# Patient Record
Sex: Female | Born: 1975 | Race: Black or African American | Hispanic: No | Smoking: Never smoker
Health system: Southern US, Community
[De-identification: ages and names within clinical notes are randomized; demographics above are authoritative.]

## PROBLEM LIST (undated history)

## (undated) DIAGNOSIS — D649 Anemia, unspecified: Secondary | ICD-10-CM

## (undated) DIAGNOSIS — G43909 Migraine, unspecified, not intractable, without status migrainosus: Secondary | ICD-10-CM

## (undated) DIAGNOSIS — D72819 Decreased white blood cell count, unspecified: Secondary | ICD-10-CM

## (undated) HISTORY — DX: Decreased white blood cell count, unspecified: D72.819

## (undated) HISTORY — DX: Anemia, unspecified: D64.9

---

## 2003-04-11 ENCOUNTER — Other Ambulatory Visit: Admission: RE | Admit: 2003-04-11 | Discharge: 2003-04-11 | Payer: Self-pay | Admitting: Obstetrics and Gynecology

## 2003-06-07 ENCOUNTER — Inpatient Hospital Stay: Admission: AD | Admit: 2003-06-07 | Discharge: 2003-06-07 | Payer: Self-pay

## 2003-10-18 ENCOUNTER — Inpatient Hospital Stay (HOSPITAL_COMMUNITY): Admission: AD | Admit: 2003-10-18 | Discharge: 2003-10-22 | Payer: Self-pay | Admitting: Obstetrics and Gynecology

## 2003-11-26 ENCOUNTER — Other Ambulatory Visit: Admission: RE | Admit: 2003-11-26 | Discharge: 2003-11-26 | Payer: Self-pay | Admitting: Obstetrics and Gynecology

## 2008-07-17 ENCOUNTER — Emergency Department (HOSPITAL_COMMUNITY): Admission: EM | Admit: 2008-07-17 | Discharge: 2008-07-17 | Payer: Self-pay | Admitting: Emergency Medicine

## 2010-07-23 LAB — POCT I-STAT, CHEM 8
Chloride: 106 mEq/L (ref 96–112)
Glucose, Bld: 121 mg/dL — ABNORMAL HIGH (ref 70–99)
HCT: 38 % (ref 36.0–46.0)
Potassium: 4.5 mEq/L (ref 3.5–5.1)
Sodium: 136 mEq/L (ref 135–145)

## 2010-07-23 LAB — URINALYSIS, ROUTINE W REFLEX MICROSCOPIC
Bilirubin Urine: NEGATIVE
Glucose, UA: NEGATIVE mg/dL
Ketones, ur: NEGATIVE mg/dL
Specific Gravity, Urine: 1.029 (ref 1.005–1.030)
pH: 7 (ref 5.0–8.0)

## 2010-08-29 NOTE — Op Note (Signed)
NAME:  Veronica Medina, Veronica Medina                        ACCOUNT NO.:  [id number]   MEDICAL RECORD NO.:  [id number]                   PATIENT TYPE:  INP   LOCATION:  9140                                 FACILITY:  [facility]   PHYSICIAN:  Guy Sandifer. Arleta Creek, M.D.           DATE OF BIRTH:  [birth year]   DATE OF PROCEDURE:  10/19/2003  DATE OF DISCHARGE:                                 OPERATIVE REPORT   PREOPERATIVE DIAGNOSES:  1. Intrauterine pregnancy at 54 1/2 weeks estimated gestational age.  2. Previous cesarean section desires repeat.  3. Labor.   POSTOPERATIVE DIAGNOSES:  1. Intrauterine pregnancy at 61 1/2 weeks estimated gestational age.  2. Previous cesarean section desires repeat.  3. Labor.   PROCEDURE:  Low transverse cesarean section.   SURGEON:  Guy Sandifer. Henderson Cloud, M.D.   ANESTHESIA:  Spinal, Burnett Corrente, M.D.   ESTIMATED BLOOD LOSS:  600 mL.   FINDINGS:  Viable female infant Apgar of 9 & 9 at 1 and 5 minutes  respectively.  Birth weight 6 pounds 9 ounces, arterial cord pH 7.32.   INDICATIONS AND CONSENTS:  This patient is a 35 year old, married, black  female, G2, [place], EDC of July 17, 20052005, prenatal care complicated by  previous cesarean section. She desires repeat. Group B strep culture is  negative. The patient complains of contractions. She is found to have a  cervix fingertip dilated, 90% effaced, -1 station with regular uterine  contractions. Diagnosis of labor is made. Repeat cesarean section is  discussed with the patient. The potential risks and complications including  but not limited to infection, bowel, ureteral damage, bleeding requiring  transfusion of blood products and possible transfusion reaction, HIV and  hepatitis acquisition, DVT, PE, and pneumonia is discussed preoperatively.  The patient initially desires tubal ligation.  Risk and permanence are  discussed.  However, prior to going to the operating room, the patient  declines tubal ligation. This  change is designated and initialed on the  consent form prior to transporting to the surgery suite.   DESCRIPTION OF PROCEDURE:  The patient was taken to the operating room where  she was identified, spinal anesthetic was placed and placed in the dorsal  supine position with a 15 degree left lateral wedge.  She was prepped, Foley  catheter was placed, the bladder was drained and she was draped in a sterile  fashion. After testing for adequate spinal anesthesia, the previous  Pfannenstiel scar was taken out on the way in and dissection was carried out  in layers to the peritoneum. The peritoneum is incised, extended superiorly  and inferiorly. The vesicouterine peritoneum is taken down cephalolaterally,  the bladder flap is developed and the bladder blade is placed.  The uterus  is incised in a low transverse manner and the uterus is entered bluntly with  a hemostat. The uterine incision is extended cephalolaterally with the  fingers.  Artificial rupture of membranes  for clear fluid is carried out.  The baby is posterior. Vertex is delivered and the oral and nasopharynx are  suctioned.  The single nuchal cord is reduced. The baby is delivered. Good  cry and tone is noted, the cord is clamped and the baby is handed to the  waiting pediatrics team. The placenta is manually delivered.  The uterus is  clean. The uterus is closed in two running locking imbricating layers of #0  Monocryl suture. The tubes and ovaries palpate normally. The anterior  peritoneum is closed in a running fashion with #0 Monocryl suture which is  also used to reapproximate the pyramidalis muscle in the midline.  The  fascia is closed in a running fashion with #0 PDS suture and the skin is  closed with clips. All sponge, instrument and needle counts are correct and  the patient is transferred to the recovery room in stable condition.                                               Guy Sandifer Arleta Creek, M.D.    JET/MEDQ   D:  10/19/2003  T:  10/19/2003  Job:  [id number]

## 2010-08-29 NOTE — Discharge Summary (Signed)
NAME:  Veronica Medina, Veronica Medina                        ACCOUNT NO.:  [id number]   MEDICAL RECORD NO.:  [id number]                   PATIENT TYPE:  INP   LOCATION:  9140                                 FACILITY:  [facility]   PHYSICIAN:  Dineen Kid. Rana Snare, M.D.                 DATE OF BIRTH:  30-Apr-1975   DATE OF ADMISSION:  10/18/2003  DATE OF DISCHARGE:  10/22/2003                                 DISCHARGE SUMMARY   ADMITTING DIAGNOSES:  1. Intrauterine pregnancy at 90 and five-sevenths weeks estimated     gestational age.  2. Active labor.  3. Previous cesarean section, desires repeat.   DISCHARGE DIAGNOSES:  1. Status post low transverse cesarean section.  2. Viable female infant.   PROCEDURE:  Repeat low transverse cesarean section.   REASON FOR ADMISSION:  Please see written H&P.   HOSPITAL COURSE:  The patient is a 35 year old African-American married  female gravida 2 para 1 that presented to [facility] in  labor.  The patient was found to have a cervix that was a fingertip dilated,  90% effaced, vertex at a -1 station with regular uterine contractions.  The  patient had had a previous cesarean and she desired repeat.  The patient was  then taken to the operating room where spinal anesthesia was administered  without difficulty.  A low transverse incision was made with the delivery of  a viable female infant weighing 6 pounds 9 ounces with Apgars of 9 at one  minute and 9 at five minutes.  Umbilical cord pH was 7.32.  The patient  tolerated the procedure well and was taken to the recovery room in stable  condition.  On postoperative day #1 the patient was without complaint.  Vital signs were stable; she was afebrile.  Abdomen was soft with good  return of bowel function.  Fundus was firm and nontender.  Abdominal  dressing was noted to be clean, dry, and intact.  On postoperative day #2  vital signs were stable; she was afebrile.  Abdomen was soft, fundus was  firm.   Abdominal dressing had been removed revealing an incision that was  clean, dry, and intact.  She was ambulating well, tolerating a regular diet  without complaints of nausea and vomiting.  Labs revealed hemoglobin of 8.6.  The patient was started on some iron supplementation and CBC was reordered  for the a.m.  On postoperative day #3 vital signs were stable; she was  afebrile.  Fundus was firm and nontender.  Incision was clean, dry, and  intact.  Staples were removed.  Repeat CBC revealed hemoglobin of 8.8 which  was stable and the patient was discharged home.   CONDITION ON DISCHARGE:  Good.   DIET:  Regular as tolerated.   ACTIVITY:  No heavy lifting, no driving x2 weeks, no vaginal entry.   FOLLOW-UP:  The patient is to follow up in the  office in 1 week for an  incision check.  She is to call for temperature greater than 100 degrees,  persistent nausea and vomiting, heavy vaginal bleeding, and/or redness or  drainage from the incisional site.   DISCHARGE MEDICATIONS:  1. Tylox #30 one p.o. q.4-6h. p.r.n.  2. Motrin 600 mg q.6h.  3. Ferrous sulfate 325 mg one p.o. b.i.d. with meals.  4. Colace one p.o. daily p.r.n.     Julio Sicks, N.P.                        Dineen Kid Rana Snare, M.D.    CC/MEDQ  D:  11/20/2003  T:  11/20/2003  Job:  [id number]

## 2011-12-11 ENCOUNTER — Encounter (HOSPITAL_COMMUNITY): Payer: Self-pay | Admitting: *Deleted

## 2011-12-11 ENCOUNTER — Emergency Department (HOSPITAL_COMMUNITY)
Admission: EM | Admit: 2011-12-11 | Discharge: 2011-12-11 | Disposition: A | Discharge status: Home / Self Care | Payer: Self-pay | Attending: Emergency Medicine | Admitting: Emergency Medicine

## 2011-12-11 DIAGNOSIS — G43909 Migraine, unspecified, not intractable, without status migrainosus: Secondary | ICD-10-CM | POA: Insufficient documentation

## 2011-12-11 HISTORY — DX: Migraine, unspecified, not intractable, without status migrainosus: G43.909

## 2011-12-11 MED ORDER — DIPHENHYDRAMINE HCL 12.5 MG/5ML PO ELIX
ORAL_SOLUTION | ORAL | Status: AC
  Administered 2011-12-11: 19:00:00
  Filled 2011-12-11: qty 5

## 2011-12-11 MED ORDER — IBUPROFEN 800 MG PO TABS
800.0000 mg | ORAL_TABLET | Freq: Once | ORAL | Status: AC
  Administered 2011-12-11: 800 mg via ORAL
  Filled 2011-12-11: qty 1

## 2011-12-11 MED ORDER — NAPROXEN 500 MG PO TABS: 500.0000 mg | ORAL_TABLET | Freq: Two times a day (BID) | ORAL | Status: AC

## 2011-12-11 MED ORDER — DIPHENHYDRAMINE HCL 12.5 MG/5ML PO ELIX
25.0000 mg | ORAL_SOLUTION | Freq: Once | ORAL | Status: AC
  Administered 2011-12-11: 25 mg via ORAL
  Filled 2011-12-11: qty 5

## 2011-12-11 MED ORDER — DIAZEPAM 5 MG PO TABS
5.0000 mg | ORAL_TABLET | Freq: Once | ORAL | Status: AC
  Administered 2011-12-11: 5 mg via ORAL
  Filled 2011-12-11: qty 1

## 2011-12-11 MED ORDER — METOCLOPRAMIDE HCL 5 MG/ML IJ SOLN
10.0000 mg | Freq: Once | INTRAMUSCULAR | Status: AC
  Administered 2011-12-11: 10 mg via INTRAMUSCULAR
  Filled 2011-12-11: qty 2

## 2011-12-11 MED ORDER — IBUPROFEN 800 MG PO TABS
ORAL_TABLET | ORAL | Status: AC
  Administered 2011-12-11: 19:00:00
  Filled 2011-12-11: qty 1

## 2011-12-11 NOTE — ED Provider Notes (Signed)
History     CSN: [id number]  Arrival date & time 12/11/11  1433   First MD Initiated Contact with Patient 12/11/11 1739      Chief Complaint  Patient presents with  . Migraine    (Consider location/radiation/quality/duration/timing/severity/associated sxs/prior treatment) Patient is a 36 y.o. female presenting with migraine and headaches. The history is provided by the patient.  Migraine This is a recurrent problem. The current episode started today. The problem occurs rarely. The problem has been gradually worsening. Associated symptoms include headaches, nausea and a visual change (blurred vision, photophobia). Pertinent negatives include no abdominal pain, anorexia, arthralgias, change in bowel habit, chest pain, chills, congestion, coughing, diaphoresis, fatigue, fever, myalgias, neck pain, numbness, rash, sore throat, swollen glands, urinary symptoms, vomiting or weakness. Exacerbated by: light sound and movement  She has tried nothing for the symptoms.  Headache  The headache is associated with the menstrual cycle. The pain is located in the bilateral and frontal region. The quality of the pain is described as throbbing. The pain is at a severity of 8/10. The pain is moderate. The pain does not radiate. Associated symptoms include nausea. Pertinent negatives include no anorexia, no fever, no malaise/fatigue, no chest pressure, no near-syncope, no orthopnea, no palpitations, no syncope, no shortness of breath and no vomiting. She has tried nothing for the symptoms.    Past Medical History  Diagnosis Date  . Migraine     Past Surgical History  Procedure Date  . Cesarean section     No family history on file.  History  Substance Use Topics  . Smoking status: Never Smoker   . Smokeless tobacco: Not on file  . Alcohol Use: Yes     occasionally    OB History    Grav Para Term Preterm Abortions TAB SAB Ect Mult Living                  Review of Systems  Constitutional:  Negative for fever, chills, malaise/fatigue, diaphoresis and fatigue.  HENT: Negative for congestion, sore throat and neck pain.   Eyes: Positive for photophobia. Negative for pain.  Respiratory: Negative for cough and shortness of breath.   Cardiovascular: Negative for chest pain, palpitations, orthopnea, syncope and near-syncope.  Gastrointestinal: Positive for nausea. Negative for vomiting, abdominal pain, anorexia and change in bowel habit.  Musculoskeletal: Negative for myalgias and arthralgias.  Skin: Negative for rash.  Neurological: Positive for headaches. Negative for weakness and numbness.  All other systems reviewed and are negative.    Allergies  Aspirin  Home Medications   Current Outpatient Rx  Name Route Sig Dispense Refill  . ACETAMINOPHEN 500 MG PO TABS Oral Take 1,000 mg by mouth every 6 (six) hours as needed. Pain    . ADULT MULTIVITAMIN W/MINERALS CH Oral Take 1 tablet by mouth daily.    Marland Kitchen NORGESTIMATE-ETH ESTRADIOL 0.25-35 MG-MCG PO TABS Oral Take 1 tablet by mouth daily.      BP 98/64  Pulse 77  Temp 98.1 F (36.7 C) (Oral)  Resp 18  SpO2 100%  LMP 11/20/2011  Physical Exam  Nursing note and vitals reviewed. Constitutional: She is oriented to person, place, and time. She appears well-developed and well-nourished. No distress.  HENT:  Head: Normocephalic and atraumatic.  Right Ear: External ear normal.  Left Ear: External ear normal.  Eyes: Conjunctivae and EOM are normal. Pupils are equal, round, and reactive to light. Right eye exhibits no discharge. Left eye exhibits no discharge. Right conjunctiva  is not injected. Right conjunctiva has no hemorrhage. Left conjunctiva is not injected. Left conjunctiva has no hemorrhage. No scleral icterus. Right eye exhibits no nystagmus. Left eye exhibits no nystagmus.  Neck: Normal range of motion and full passive range of motion without pain. Neck supple. No spinous process tenderness present. No rigidity.    Cardiovascular: Normal rate, regular rhythm, normal heart sounds and intact distal pulses.   Pulmonary/Chest: Effort normal and breath sounds normal. No respiratory distress.  Musculoskeletal: Normal range of motion.  Neurological: She is alert and oriented to person, place, and time. She has normal strength. No cranial nerve deficit or sensory deficit. Coordination and gait normal.  Skin: Skin is warm and dry. No rash noted. She is not diaphoretic.    ED Course  Procedures (including critical care time)  Labs Reviewed - No data to display No results found.   No diagnosis found.    MDM  Migraine   Pt HA treated and improved while in ED.  Presentation is like pts typical HA and non concerning for [facility], ICH, Meningitis, or temporal arteritis. Pt is afebrile with no focal neuro deficits, nuchal rigidity, or change in vision. Pt is to follow up with PCP to discuss prophylactic medication. Pt verbalizes understanding and is agreeable with plan to dc.          Jaci Carrel, [place] 12/11/11 1933

## 2011-12-11 NOTE — ED Notes (Signed)
Pt reports migraine x1hr. Hx migraines in last year. Sometimes she feels like she can sleep them off or take excedrin. Has not tried anything for this migraine. +light and sound sensitivity, nausea. Denies vomiting.

## 2011-12-12 NOTE — ED Provider Notes (Signed)
Medical screening examination/treatment/procedure(s) were performed by non-physician practitioner and as supervising physician I was immediately available for consultation/collaboration.  Amberlie Gaillard, MD 12/12/11 0010 

## 2015-04-23 ENCOUNTER — Other Ambulatory Visit: Payer: Self-pay | Admitting: Obstetrics and Gynecology

## 2015-04-23 DIAGNOSIS — N6320 Unspecified lump in the left breast, unspecified quadrant: Secondary | ICD-10-CM

## 2015-04-25 ENCOUNTER — Other Ambulatory Visit: Payer: Self-pay

## 2015-04-29 ENCOUNTER — Ambulatory Visit
Admission: RE | Admit: 2015-04-29 | Discharge: 2015-04-29 | Disposition: A | Discharge status: Home / Self Care | Payer: BLUE CROSS/BLUE SHIELD | Source: Ambulatory Visit | Attending: Obstetrics and Gynecology | Admitting: Obstetrics and Gynecology

## 2015-04-29 DIAGNOSIS — N6320 Unspecified lump in the left breast, unspecified quadrant: Secondary | ICD-10-CM

## 2016-02-26 ENCOUNTER — Other Ambulatory Visit: Payer: Self-pay | Admitting: Obstetrics and Gynecology

## 2016-02-26 DIAGNOSIS — N6459 Other signs and symptoms in breast: Secondary | ICD-10-CM

## 2016-02-26 DIAGNOSIS — N63 Unspecified lump in unspecified breast: Secondary | ICD-10-CM

## 2016-03-17 ENCOUNTER — Other Ambulatory Visit: Payer: BLUE CROSS/BLUE SHIELD

## 2016-03-23 ENCOUNTER — Ambulatory Visit
Admission: RE | Admit: 2016-03-23 | Discharge: 2016-03-23 | Disposition: A | Discharge status: Home / Self Care | Payer: BLUE CROSS/BLUE SHIELD | Source: Ambulatory Visit | Attending: Obstetrics and Gynecology | Admitting: Obstetrics and Gynecology

## 2016-03-23 DIAGNOSIS — N6459 Other signs and symptoms in breast: Secondary | ICD-10-CM

## 2016-03-23 DIAGNOSIS — N63 Unspecified lump in unspecified breast: Secondary | ICD-10-CM

## 2017-04-27 ENCOUNTER — Other Ambulatory Visit: Payer: Self-pay | Admitting: Obstetrics and Gynecology

## 2017-04-27 DIAGNOSIS — N6320 Unspecified lump in the left breast, unspecified quadrant: Secondary | ICD-10-CM

## 2017-05-03 ENCOUNTER — Other Ambulatory Visit: Payer: BLUE CROSS/BLUE SHIELD

## 2017-05-07 ENCOUNTER — Other Ambulatory Visit: Payer: Self-pay

## 2017-05-10 ENCOUNTER — Ambulatory Visit: Payer: BLUE CROSS/BLUE SHIELD

## 2017-05-10 ENCOUNTER — Ambulatory Visit
Admission: RE | Admit: 2017-05-10 | Discharge: 2017-05-10 | Disposition: A | Discharge status: Home / Self Care | Payer: BLUE CROSS/BLUE SHIELD | Source: Ambulatory Visit | Attending: Obstetrics and Gynecology | Admitting: Obstetrics and Gynecology

## 2017-05-10 ENCOUNTER — Other Ambulatory Visit: Payer: Self-pay | Admitting: Obstetrics and Gynecology

## 2017-05-10 DIAGNOSIS — N6320 Unspecified lump in the left breast, unspecified quadrant: Secondary | ICD-10-CM

## 2017-05-10 DIAGNOSIS — Z1231 Encounter for screening mammogram for malignant neoplasm of breast: Secondary | ICD-10-CM

## 2017-05-18 ENCOUNTER — Ambulatory Visit (HOSPITAL_COMMUNITY): Payer: BLUE CROSS/BLUE SHIELD

## 2017-05-24 ENCOUNTER — Other Ambulatory Visit: Payer: Self-pay

## 2017-05-24 ENCOUNTER — Ambulatory Visit (HOSPITAL_COMMUNITY)
Admission: RE | Admit: 2017-05-24 | Discharge: 2017-05-24 | Disposition: A | Discharge status: Home / Self Care | Payer: BLUE CROSS/BLUE SHIELD | Source: Ambulatory Visit | Attending: Obstetrics and Gynecology | Admitting: Obstetrics and Gynecology

## 2017-05-24 DIAGNOSIS — Z148 Genetic carrier of other disease: Secondary | ICD-10-CM

## 2017-05-25 DIAGNOSIS — Z148 Genetic carrier of other disease: Secondary | ICD-10-CM | POA: Insufficient documentation

## 2017-05-25 NOTE — Progress Notes (Signed)
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    In summary:  Discussed Fragile X syndrome and X-linked inheritance  Discussed that FMR1 mutations display "anticipation" meaning the possibility to expand to larger mutations when passed to subsequent generations  Patient's CGG repeat size in FMR1 is considered a "premutation," and is at the lower end of the premutation range (55-200 CGG repeats)  Discussed 50% risk to pass down FMR1 allele to each offspring, but risk to expand to full mutation if inherited is approximately <1%   AGG interruption studies are currently pending; this has been shown to further refine the risk for premutation expansion in subsequent generations  Discussed increased risk for Premature ovarian insufficieny and Fragile X associated tremor/ataxia syndrome for premutation carriers  Discussed option of prenatal diagnosis in a future pregnancy via CVS or amniocentesis  Veronica Medina reported his father has hemochromatosis  Reviewed autosomal recessive inheritance and that Veronica Medina would be obligate carrier based on this history  Discussed that he should make his PCP aware of this history   Veronica Medina had pan-ethnic carrier screening ([facility] [employer] through [employer]) facilitated through her OB/GYN office. 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 Specifically, she was reported to be positive for a premutation size 55 CGG repeat allele and a normal size 29 CGG repeat allele in the FMR1 genes. We discussed that her carrier screening was negative for the additional mutations/3 conditions  screened (cystic fibrosis, spinal muscular atrophy, and duchenne muscular dystrophy). Thus, her risk to be a carrier for these additional conditions has been reduced but not eliminated. Veronica Medina reported no known family history of Fragile X syndrome and no known additional relatives to be premutation or mutation carriers of Fragile X syndrome. The couple has been seen by Reproductive Endocrinology and Infertility provider at [facility] given a recent history of infertility. She reported no known family history of tremor or ataxia or additional family history of fertility concerns.   We discussed chromosomes and genes. We reviewed that females typically have two X chromosomes, and males typically have one X and one Y chromosome. We reviewed the general concepts of X-linked inheritance given that the causative gene for Fragile X syndrome, FMR1, is located on the X chromosome. In X-linked inheritance, a female passes on his X chromosome to all of his daughters and none of his sons. For a female, there is a 1 in 2 (50%) chance of passing on a particular X chromosome to each child. Thus, when a female is a carrier for an X-linked condition, having one copy of a nonworking gene on one X chromosome, there is a 25% chance for each of the following: 1) a daughter who inherits the nonworking gene and is also a carrier, 2) a daughter who inherits the working copy of the gene and is neither a carrier nor affected, 3) a son who inherits the nonworking copy of the gene and is affected, or 4) a son who inherits the working copy of the gene and is unaffected.   Fragile X syndrome is a form of inherited intellectual disability where predominantly affected males are born  to typically unaffected females, though there is variability in the condition. Females who are carriers of a full mutation may be asymptomatic or have milder features of Fragile X syndrome. More than 99% of individuals with Fragile X syndrome  results from an expanded segment (an expansion of the CGG trinucleotide repeat) of the FMR1 gene on the X chromosome. (Less than 1% of individuals with Fragile X syndrome have a deletion of FMR1 or an intragenic FMR1 mutation.) Regarding the number of trinucleotide repeats in the FMR1 gene, less than 45 repeats of this region is considered within normal limits. People with 45-54 repeats are typically considered to be in the "intermediate" or "gray zone" range. Individuals with 55-200 repeats are considered "premutation carriers" and are at personal risk to develop certain health concerns including premature ovarian insufficiency (POI) in females and Fragile X-associated tremor/ataxia syndrome (FXTAS) in males and females. Female premutation carriers have an estimated up to 17% risk of developing FXTAS. We discussed that symptoms of FXTAS may include ataxia, tremor, memory loss, and peripheral neuropathy with onset after age 39 years. Features of primary ovarian insufficiency may include irregular menstrual cycles, early-onset elevated levels of FSH, early menopause, and infertility. Approximately 10-20% of women who carry a premutation in FMR1 have features of POI, with repeat sizes on the lower end of the premutation range less associated with development of POI.  We discussed that Fragile X syndrome demonstrates anticipation, meaning that the CGG trinucleotide repeat has the potential to expand in size when passed to subsequent generations. Females who are premutation carriers are at increased risk to have children affected with Fragile X syndrome, given the chance for a premutation to expand to a full mutation when transmitted to offspring. Individuals with greater than 200 repeats have full mutations and are expected to be clinically affected, if female, and be carriers or possibly clinically affected, if female. Carriers of full mutations (>200 CGG repeats) can be asymptomatic or have mild symptoms, such as  learning disabilities. Larger repeat sizes are more associated with increased risk for repeat size expansion if inherited by a subsequent generation. Available literature has shown that individuals who also have an AGG trinucleotide repeat within their FMR1 gene have a lower chance of the CGG repeat expanding when passed to offspring. Veronica Medina AGG interruption studies are currently pending. We discussed that this will help refine risk for premutation expansion if inherited by a subsequent generation. Overall, for premutations in the range of 55-59 CGG repeats, risk of expansion to a full mutation in subsequent generations is estimated to be less than 1% (Nolin et al. 2003 and Nolin et al. 2011).     In summary, Veronica Medina's offspring have a 1 in 2 (50%) chance to have inherited the FMR1 allele with the expanded CGG repeats; if the premutation allele was inherited, the risk that it has expanded to a full mutation is approximately <1% for carriers with a premutation in the range of 55-59 CGG repeats. We discussed the prenatal diagnosis for Fragile X would be available in a future pregnancy via CVS or amniocentesis. Prenatal screening via cell free DNA testing and detailed ultrasound can help refine risk assessment by assessing fetal sex, but do not directly screen for Fragile X syndrome.  We also discussed that Veronica Medina's daughters should be informed of this information and have the option of FMR1 carrier screening when they are of reproductive age.   Both family histories were reviewed and were otherwise contributory for Hemochromatosis for Mr.  Veronica Medina's father.  Hemochromatosis, also referred to as HFE-associated hereditary hemochromatosis (HFE-HH), is a common genetic condition that results when a person possesses a mutation (gene alteration) in both copies of their HFE gene.  This leads to an increased amount of iron absorption and storage of iron in the body including the skin, liver, pancreas,  heart, joints and testes.  Symptoms may include lethargy, weight loss, weakness or abdominal pain and may progress to increased skin pigmentation, diabetes mellitus, liver cirrhosis, arthritis or congestive heart failure if untreated.  Symptoms in females are usually milder and occur later in life (post-menopause).  This condition is typically treated via phlebotomy. Variable expressivity and reduced penetrance have been reported, with individuals who present with only increased transferrin-iron saturation and increased serum ferritin concentration as well as individuals who are homozygous for C282Y/C282Y who do not have iron overload nor clinical manifestations. The diagnosis of clinical hemochromatosis in individuals is typically based on finding elevated transferrin-iron saturation 45% or higher and serum ferritin concentration above the upper limit of normal and finding two HFE-HH-causing mutations via molecular genetic testing.   We reviewed genes and chromosomes and the autosomal recessive inheritance. Carriers have no symptoms of the disease and males and females have equal chances to be carriers. We discussed that all offspring of an individual with hemochromatosis would be obligate carriers. 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  However, given the relatively high carrier frequency in the general population, reduced penetrance, genetic testing and/or clinical testing would be available to determine if he would also be homozygous, which is a less likely scenario.  We discussed that it may be helpful for him to discuss this family history with primary care physician.  Further genetic counseling is warranted if more information is obtained.  Additionally, Veronica Medina reported that he was born with a hole in the heart that required surgical correction, and his paternal grandfather also had a congenital hole in the heart. No underlying etiology is known for the congenital  heart disease in the family. We reviewed that congenital heart defects (CHDs) can be isolated or a feature of an underlying genetic condition. Congenital heart defects are most often multifactorial in etiology, but can also result from chromosome aberrations, single gene conditions, or teratogenic exposures. We discussed that isolated, nonsyndromic CHDs occur in approximately 1% of the general population. We discussed that recurrence risk for CHD for the offspring of a female with apparently isolated CHD is approximately 1.5-3%.  If however, the CHD is due to an underlying genetic condition, the risk of recurrence could be increased. In this case, the specific chance of recurrence depends on the inheritance of the condition. Without further information, an accurate risk assessment cannot be provided. Further genetic counseling is warranted if more information is obtained.  We also briefly discussed maternal age and the association with risk for chromosome conditions due to nondisjunction with aging of the ova.   We reviewed chromosomes, nondisjunction, and the associated 1 in 49 risk for fetal aneuploidy related to a maternal age of 42 y.o..  We briefly discussed available screening and testing options for chromosome conditions that would be available in a future pregnancy.   I counseled this couple regarding the above risks and available options.  Most of the counseling was provided by Carmin Richmond, [employer] genetic counseling student, under my direct supervision. The approximate face-to-face time with the genetic counselor was 50 minutes.  Chipper Oman, MS Certified Genetic Counselor 05/25/2017

## 2017-05-31 ENCOUNTER — Ambulatory Visit
Admission: RE | Admit: 2017-05-31 | Discharge: 2017-05-31 | Disposition: A | Discharge status: Home / Self Care | Payer: BLUE CROSS/BLUE SHIELD | Source: Ambulatory Visit | Attending: Obstetrics and Gynecology | Admitting: Obstetrics and Gynecology

## 2017-05-31 DIAGNOSIS — Z1231 Encounter for screening mammogram for malignant neoplasm of breast: Secondary | ICD-10-CM

## 2017-06-04 ENCOUNTER — Other Ambulatory Visit (HOSPITAL_COMMUNITY): Payer: Self-pay

## 2021-03-25 ENCOUNTER — Other Ambulatory Visit: Payer: Self-pay | Admitting: Obstetrics and Gynecology

## 2021-03-25 DIAGNOSIS — R928 Other abnormal and inconclusive findings on diagnostic imaging of breast: Secondary | ICD-10-CM

## 2021-05-26 ENCOUNTER — Ambulatory Visit
Admission: RE | Admit: 2021-05-26 | Discharge: 2021-05-26 | Disposition: A | Discharge status: Home / Self Care | Payer: BC Managed Care – PPO | Source: Ambulatory Visit | Attending: Obstetrics and Gynecology | Admitting: Obstetrics and Gynecology

## 2021-05-26 ENCOUNTER — Ambulatory Visit
Admission: RE | Admit: 2021-05-26 | Discharge: 2021-05-26 | Disposition: A | Discharge status: Home / Self Care | Payer: BLUE CROSS/BLUE SHIELD | Source: Ambulatory Visit | Attending: Obstetrics and Gynecology | Admitting: Obstetrics and Gynecology

## 2021-05-26 DIAGNOSIS — R928 Other abnormal and inconclusive findings on diagnostic imaging of breast: Secondary | ICD-10-CM

## 2023-06-11 ENCOUNTER — Other Ambulatory Visit: Payer: Self-pay

## 2023-06-11 DIAGNOSIS — D649 Anemia, unspecified: Secondary | ICD-10-CM

## 2023-06-14 ENCOUNTER — Other Ambulatory Visit: Payer: BC Managed Care – PPO

## 2023-06-14 ENCOUNTER — Encounter: Payer: BC Managed Care – PPO | Admitting: Internal Medicine

## 2023-06-14 ENCOUNTER — Telehealth: Payer: Self-pay | Admitting: Internal Medicine

## 2023-06-14 NOTE — Telephone Encounter (Signed)
 Per patient request appointment rescheduled. Patient is  moving and unable to make appointment.

## 2023-07-05 ENCOUNTER — Encounter: Payer: Self-pay | Admitting: Hematology and Oncology

## 2023-07-05 ENCOUNTER — Inpatient Hospital Stay

## 2023-07-05 ENCOUNTER — Inpatient Hospital Stay: Attending: Hematology and Oncology | Admitting: Hematology and Oncology

## 2023-07-05 VITALS — BP 124/72 | HR 76 | Temp 97.4°F | Resp 18 | Wt 132.2 lb

## 2023-07-05 DIAGNOSIS — D563 Thalassemia minor: Secondary | ICD-10-CM

## 2023-07-05 DIAGNOSIS — D72819 Decreased white blood cell count, unspecified: Secondary | ICD-10-CM

## 2023-07-05 NOTE — Assessment & Plan Note (Signed)
 I have reviewed all her records Even though her primary care doctor ordered fractionated hemoglobin evaluation, it would not exclude the possibility she might have thalassemia trait It appears that it is inheritable as her siblings also have similar abnormal CBC She is reassured and is comfortable not to pursue any additional workup In the future, I recommend she gets iron studies done periodically if she was told she needs to get iron treatment to make sure that she is indeed iron deficient as her MCV will always be low

## 2023-07-05 NOTE — Assessment & Plan Note (Signed)
 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

## 2023-07-05 NOTE — Progress Notes (Signed)
 [facility] CONSULT NOTE  Patient Care Team: Patient, No Pcp Per as PCP - General (General Practice)   ASSESSMENT & PLAN  Thalassemia trait I have reviewed all her records Even though her primary care doctor ordered fractionated hemoglobin evaluation, it would not exclude the possibility she might have thalassemia trait It appears that it is inheritable as her siblings also have similar abnormal CBC She is reassured and is comfortable not to pursue any additional workup In the future, I recommend she gets iron studies done periodically if she was told she needs to get iron treatment to make sure that she is indeed iron deficient as her MCV will always be low  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   All questions were answered. The patient knows to call the clinic with any problems, questions or concerns. I spent 55 minutes counseling the patient face to face, counseling and review of plan of care.   Artis Delay, MD 07/05/2023 1:04 PM   CHIEF COMPLAINTS/PURPOSE OF CONSULTATION:  Abnormal CBC  HISTORY OF PRESENTING ILLNESS:  Veronica Medina 48 y.o. female is here because of abnormal WBC.  She was found to have abnormal CBC from routine blood draw I have the opportunity to review CBC from multiple sources of electronic records According to Care Everywhere, she has abnormal CBC on Aug 21, 2019 White blood cell count was 3.7, hemoglobin 10.4, RBC 4.5, MCV 70.4 and normal platelet count Iron studies are borderline low, normal B12 level According to labs from [facility] dated April 21, 2022, white blood cell count was 3.2, RBC count 4.6, hemoglobin 10.6 and MCV of 72 On April 26, 2023, CBC showed white blood cell count of 2.4, hemoglobin 11.1, RBC 4.99 and  MCV of 73 with normal iron studies and B12 level She denies recent infection. The last prescription antibiotics was more than 3 months ago There is not reported symptoms of sinus congestion, cough, urinary frequency/urgency or dysuria, diarrhea, joint swelling/pain or abnormal skin rash.  She had no prior history or diagnosis of cancer. Her age appropriate screening programs are up-to-date. The patient has no prior diagnosis of autoimmune disease and was not prescribed corticosteroids related products. She rarely drinks alcohol From the abnormal hemoglobin perspective, she has never needed blood transfusion support Multiple siblings have abnormal CBC She has 2 biological children and her children's labs were within normal limits  MEDICAL HISTORY:  Past Medical History:  Diagnosis Date   Anemia    Chronic leukopenia    Migraine     SURGICAL HISTORY: Past Surgical History:  Procedure Laterality Date   CESAREAN SECTION      SOCIAL HISTORY: Social History   Socioeconomic History   Marital status: Widowed    Spouse name: Not on file   Number of children: 2   Years of education: Not on file   Highest education level: Not on file  Occupational History   Occupation: hair stylist  Tobacco Use   Smoking status: Never   Smokeless tobacco: Not on file  Substance and Sexual Activity   Alcohol use: Yes    Comment: occasionally   Drug use: No   Sexual activity: Not on file  Other Topics Concern   Not on file  Social History Narrative   Not on file  Social Drivers of Corporate investment banker Strain: Not on file  Food Insecurity: Not on file  Transportation Needs: Not on file  Physical Activity: Not on file  Stress: Not on file  Social Connections: Not on file  Intimate Partner Violence: Not on file    FAMILY HISTORY: Family History  Problem Relation Age of Onset   Breast cancer Paternal Grandmother 53       lung cancer spread to breast cancer    ALLERGIES:  is  allergic to naproxen and aspirin.  MEDICATIONS:  Current Outpatient Medications  Medication Sig Dispense Refill   acetaminophen (TYLENOL) 500 MG tablet Take 1,000 mg by mouth every 6 (six) hours as needed. Pain     Multiple Vitamin (MULTIVITAMIN WITH MINERALS) TABS Take 1 tablet by mouth daily.     norgestimate-ethinyl estradiol (ORTHO-CYCLEN,SPRINTEC,PREVIFEM) 0.25-35 MG-MCG tablet Take 1 tablet by mouth daily.     No current facility-administered medications for this visit.    REVIEW OF SYSTEMS:   Constitutional: Denies fevers, chills or abnormal night sweats Eyes: Denies blurriness of vision, double vision or watery eyes Ears, nose, mouth, throat, and face: Denies mucositis or sore throat Respiratory: Denies cough, dyspnea or wheezes Cardiovascular: Denies palpitation, chest discomfort or lower extremity swelling Gastrointestinal:  Denies nausea, heartburn or change in bowel habits Skin: Denies abnormal skin rashes Lymphatics: Denies new lymphadenopathy or easy bruising Neurological:Denies numbness, tingling or new weaknesses Behavioral/Psych: Mood is stable, no new changes  All other systems were reviewed with the patient and are negative.  PHYSICAL EXAMINATION: ECOG PERFORMANCE STATUS: 0 - Asymptomatic  Vitals:   07/05/23 1111  BP: 124/72  Pulse: 76  Resp: 18  Temp: (!) 97.4 F (36.3 C)  SpO2: 100%   Filed Weights   07/05/23 1111  Weight: 132 lb 3.2 oz (60 kg)    GENERAL:alert, no distress and comfortable SKIN: skin color, texture, turgor are normal, no rashes or significant lesions EYES: normal, conjunctiva are pink and non-injected, sclera clear OROPHARYNX:no exudate, no erythema and lips, buccal mucosa, and tongue normal  NECK: supple, thyroid normal size, non-tender, without nodularity LYMPH:  no palpable lymphadenopathy in the cervical, axillary or inguinal LUNGS: clear to auscultation and percussion with normal breathing effort HEART: regular rate & rhythm  and no murmurs and no lower extremity edema ABDOMEN:abdomen soft, non-tender and normal bowel sounds Musculoskeletal:no cyanosis of digits and no clubbing  PSYCH: alert & oriented x 3 with fluent speech NEURO: no focal motor/sensory deficits  LABORATORY DATA:  I have reviewed the data as listed

## 2024-01-10 IMAGING — US US BREAST*L* LIMITED INC AXILLA
1 series · 7 of 7 positions shown · non-contrast
Comparison: Previous exam(s).

CLINICAL DATA: 45-year-old female presenting as a recall from
screening for possible left breast mass.

EXAM:
DIGITAL DIAGNOSTIC UNILATERAL LEFT MAMMOGRAM WITH TOMOSYNTHESIS AND
CAD; ULTRASOUND LEFT BREAST LIMITED
TECHNIQUE: Left digital diagnostic mammography and breast tomosynthesis was
performed. The images were evaluated with computer-aided detection.;
Targeted ultrasound examination of the left breast was performed.

[Series 1: us breast*left* limited inc axilla · 0.06mm/px · 7 of 7 slices shown]
[im 1/7]
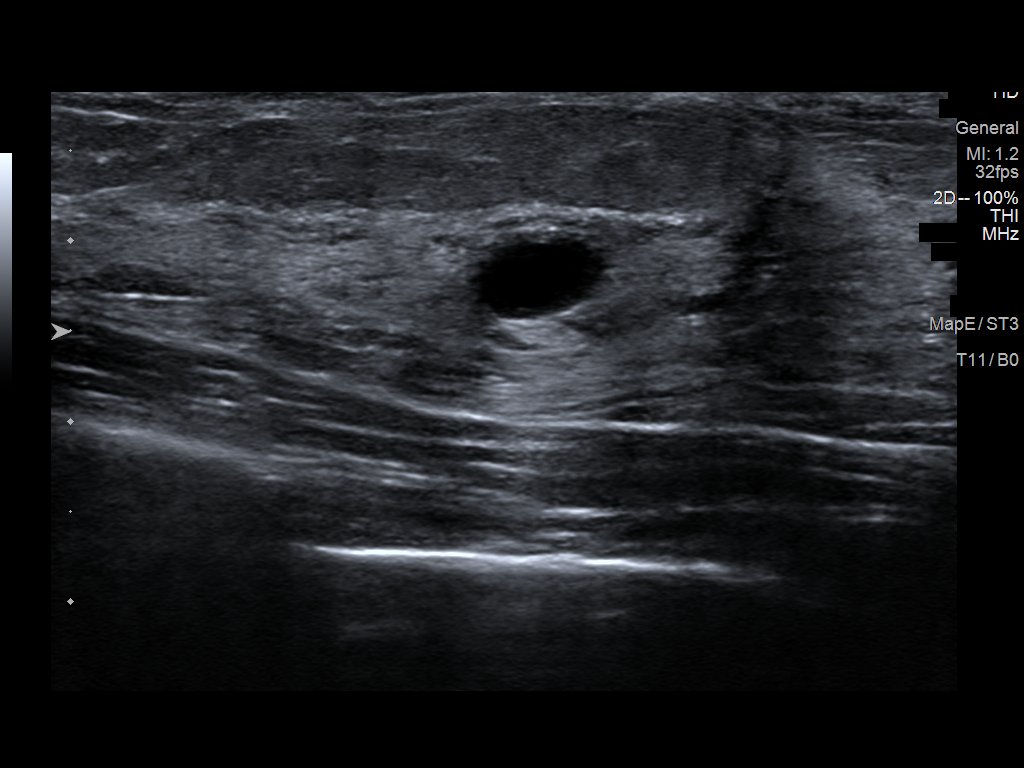
[im 2/7]
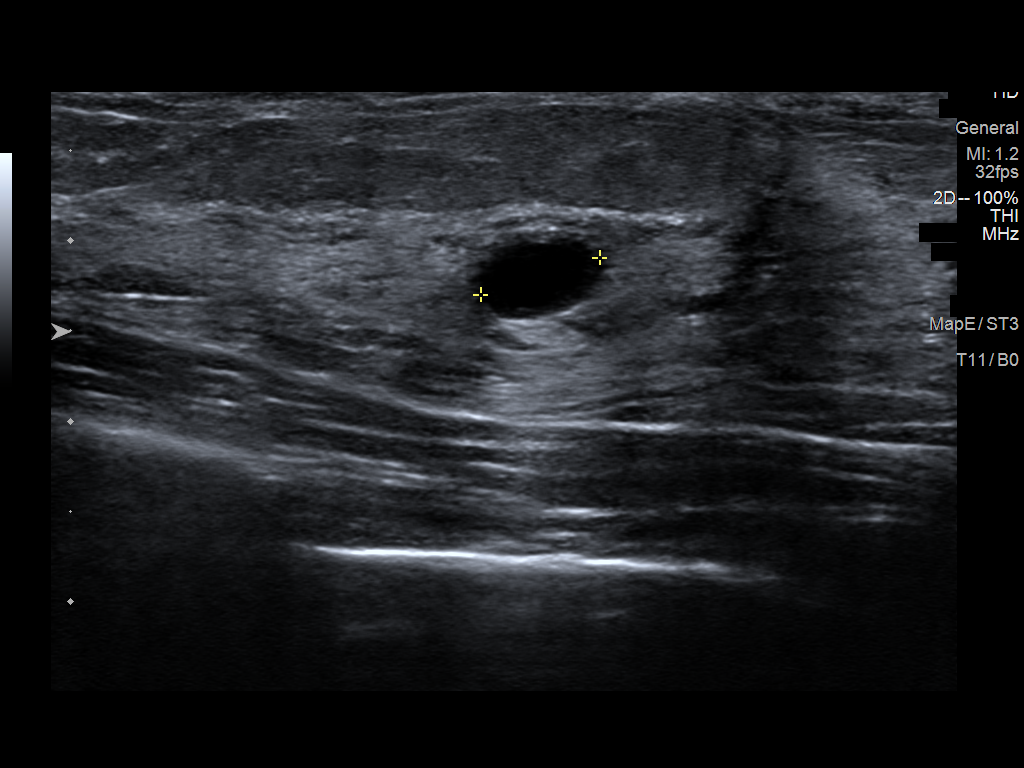
[im 3/7]
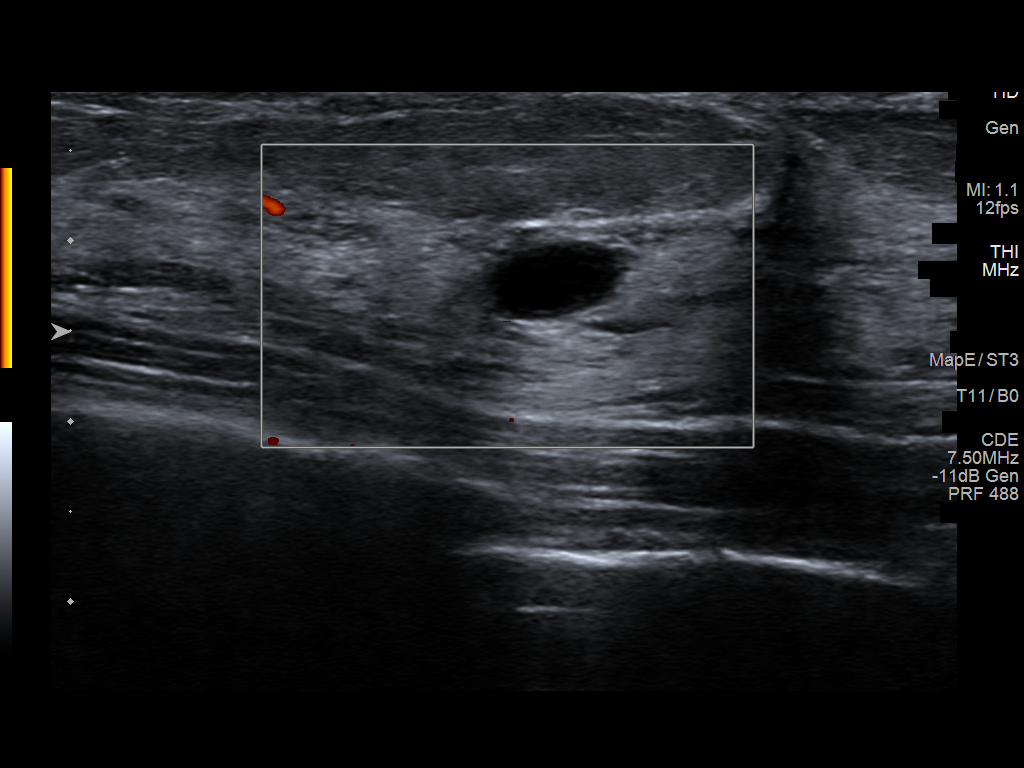
[im 4/7]
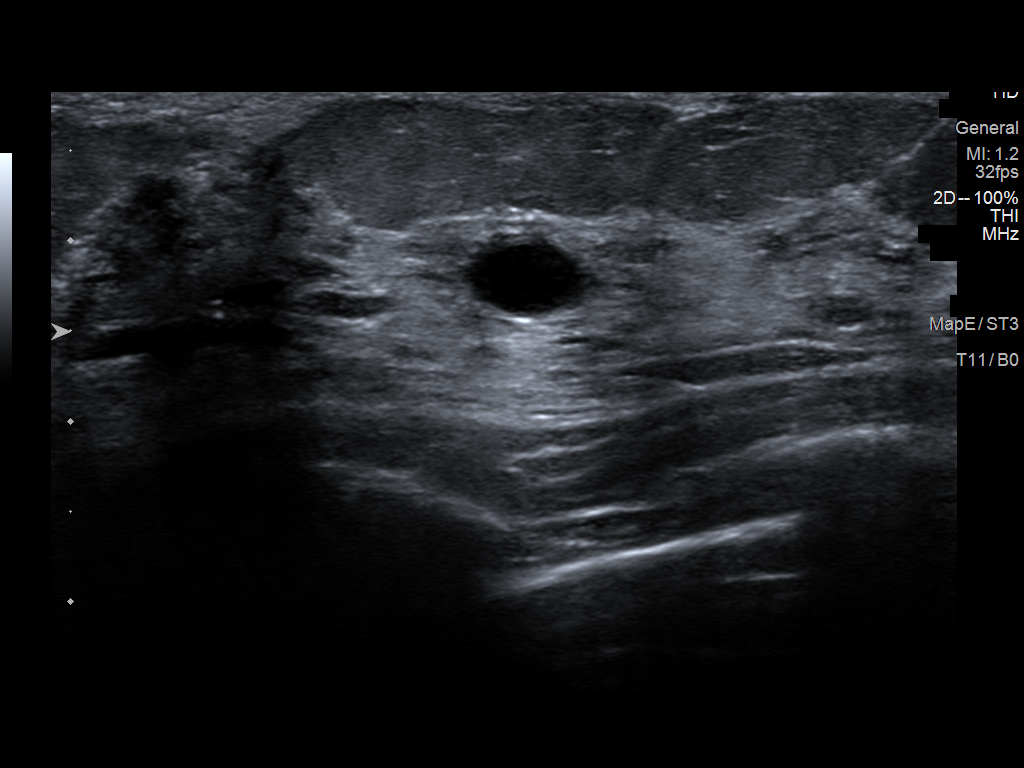
[im 5/7]
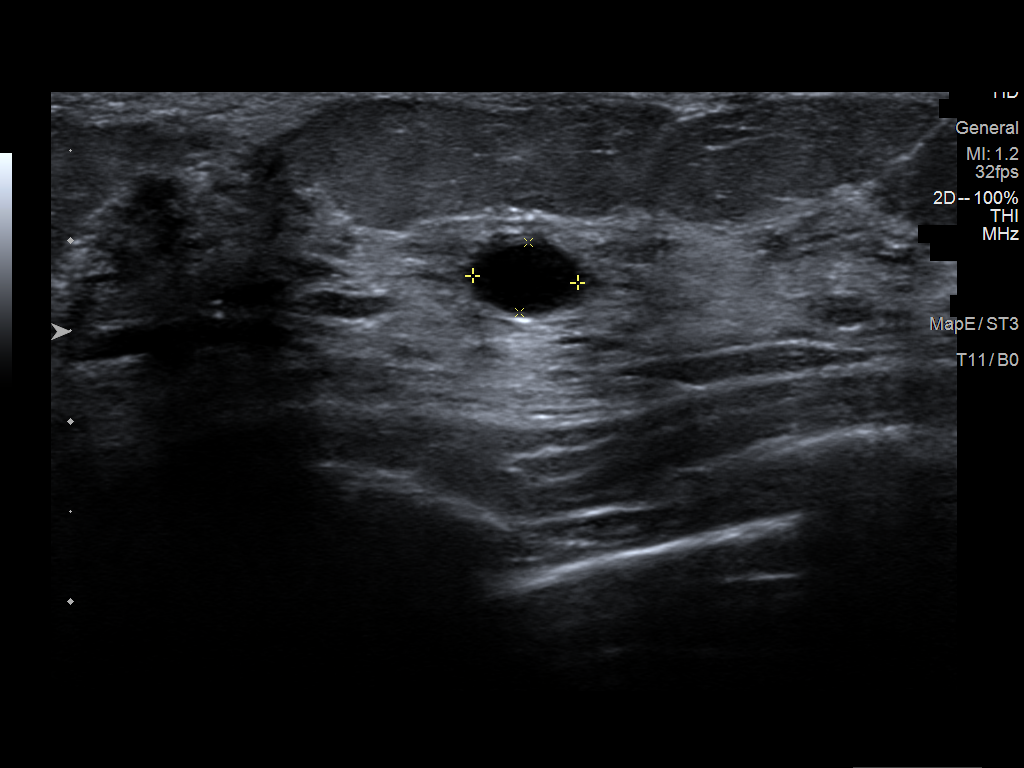
[im 6/7]
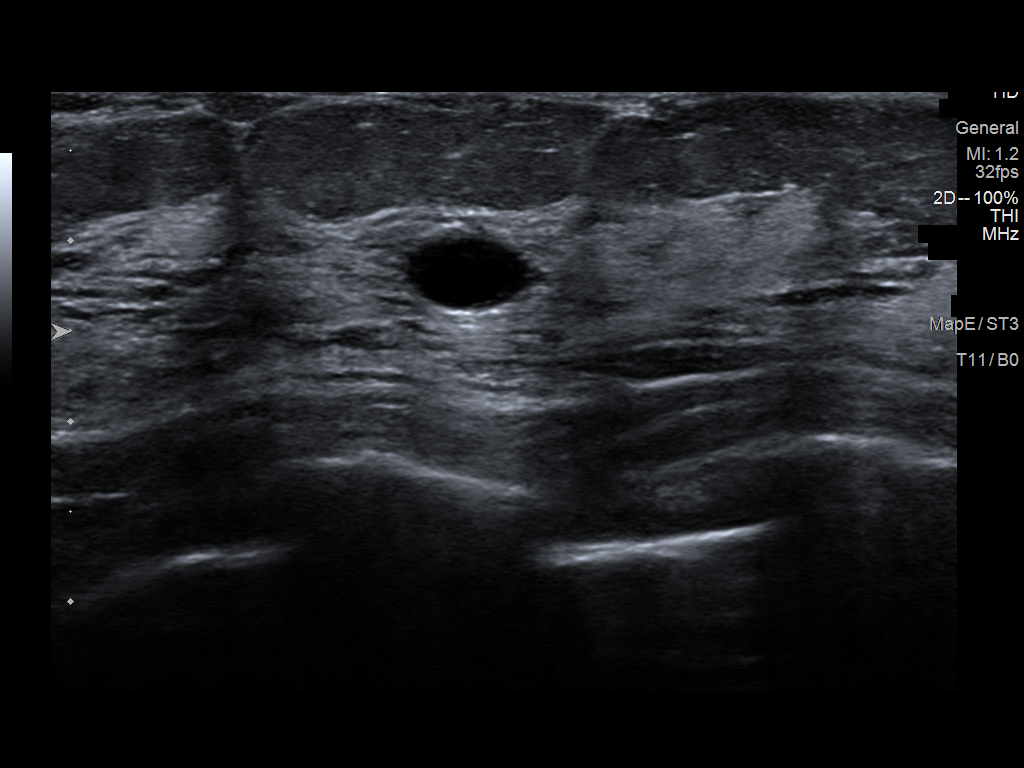
[im 7/7]
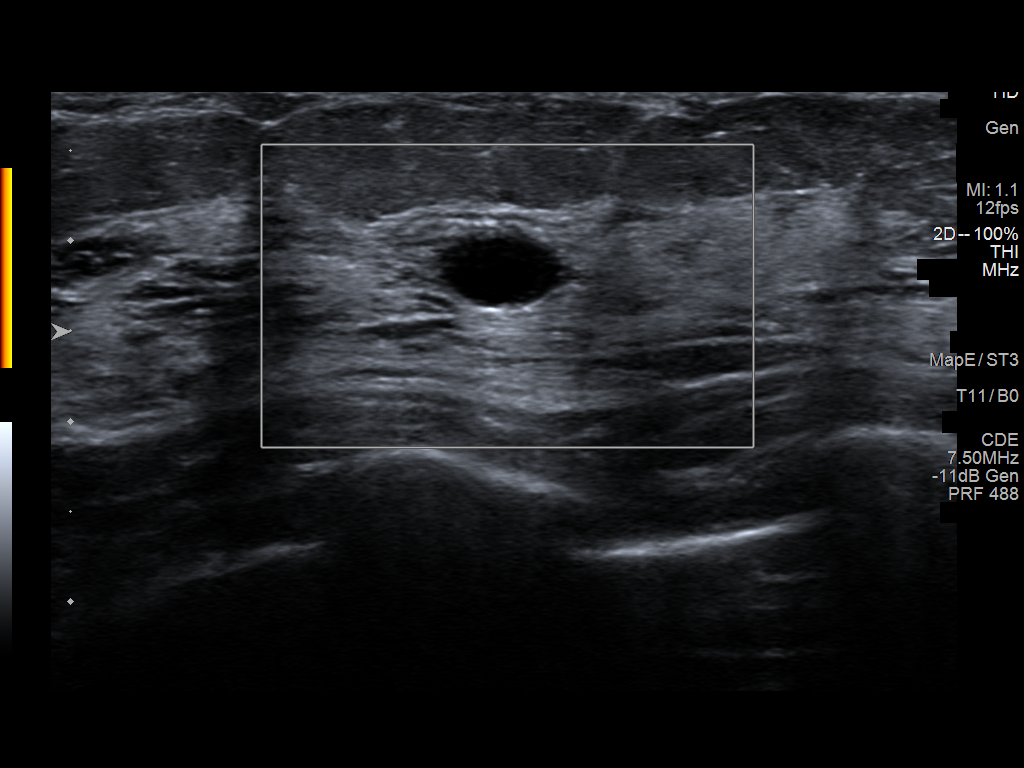

[7 of 7 positions shown; findings below may reference images not displayed]

ACR Breast Density Category c: The breast tissue is heterogeneously
dense, which may obscure small masses.
FINDINGS: Mammogram:

Spot compression tomosynthesis views and full field mL tomosynthesis
views of the left breast were performed. There is persistence of an
obscured mass in the upper outer left breast.

Ultrasound:

Targeted ultrasound performed in the left breast at 2 o'clock 2 cm
from the nipple demonstrating an oval circumscribed anechoic mass
with posterior enhancement measuring 0.6 x 0.4 x 0.7 cm, consistent
with a benign simple cyst. No internal vascularity.
IMPRESSION: Benign simple cyst in the left breast at 2 o'clock.

RECOMMENDATION:
Return to routine annual screening mammography which will be due in
March 2022.

I have discussed the findings and recommendations with the patient.
If applicable, a reminder letter will be sent to the patient
regarding the next appointment.

BI-RADS CATEGORY  2: Benign.

## 2024-01-10 IMAGING — MG MM DIGITAL DIAGNOSTIC UNILAT*L* W/ TOMO W/ CAD
6 series · 6 of 18 positions shown · non-contrast
Comparison: Previous exam(s).

CLINICAL DATA: 45-year-old female presenting as a recall from
screening for possible left breast mass.

EXAM:
DIGITAL DIAGNOSTIC UNILATERAL LEFT MAMMOGRAM WITH TOMOSYNTHESIS AND
CAD; ULTRASOUND LEFT BREAST LIMITED
TECHNIQUE: Left digital diagnostic mammography and breast tomosynthesis was
performed. The images were evaluated with computer-aided detection.;
Targeted ultrasound examination of the left breast was performed.

[L ML synth-2D]
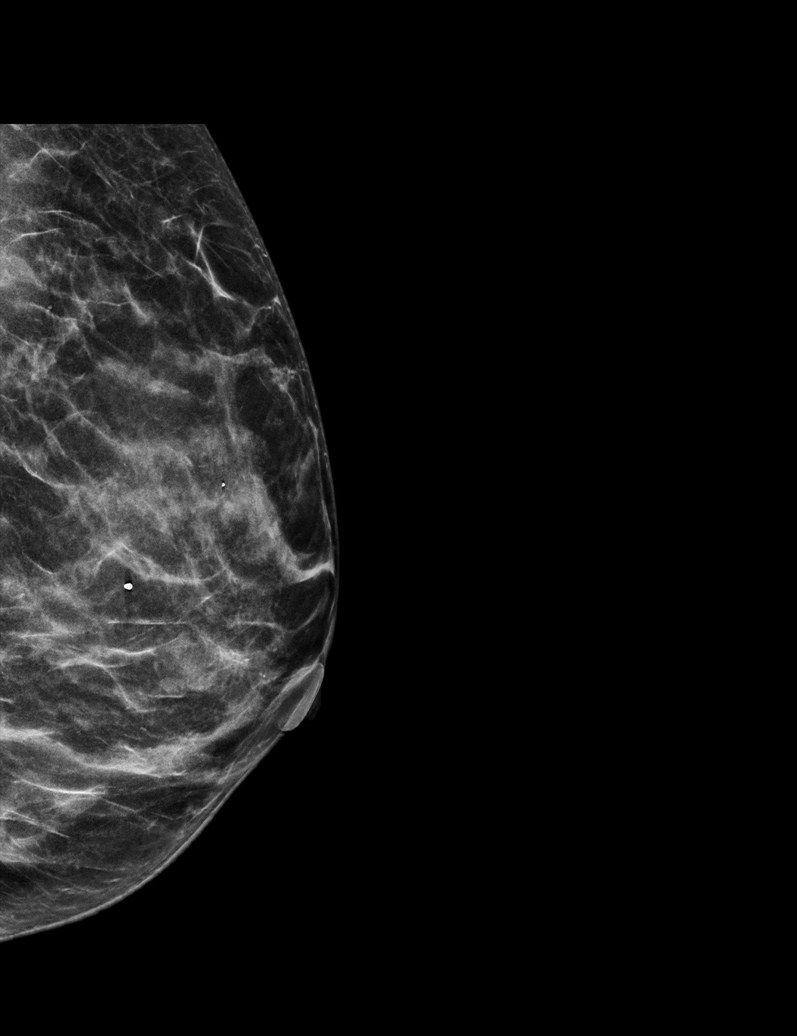

[L MLO synth-2D]
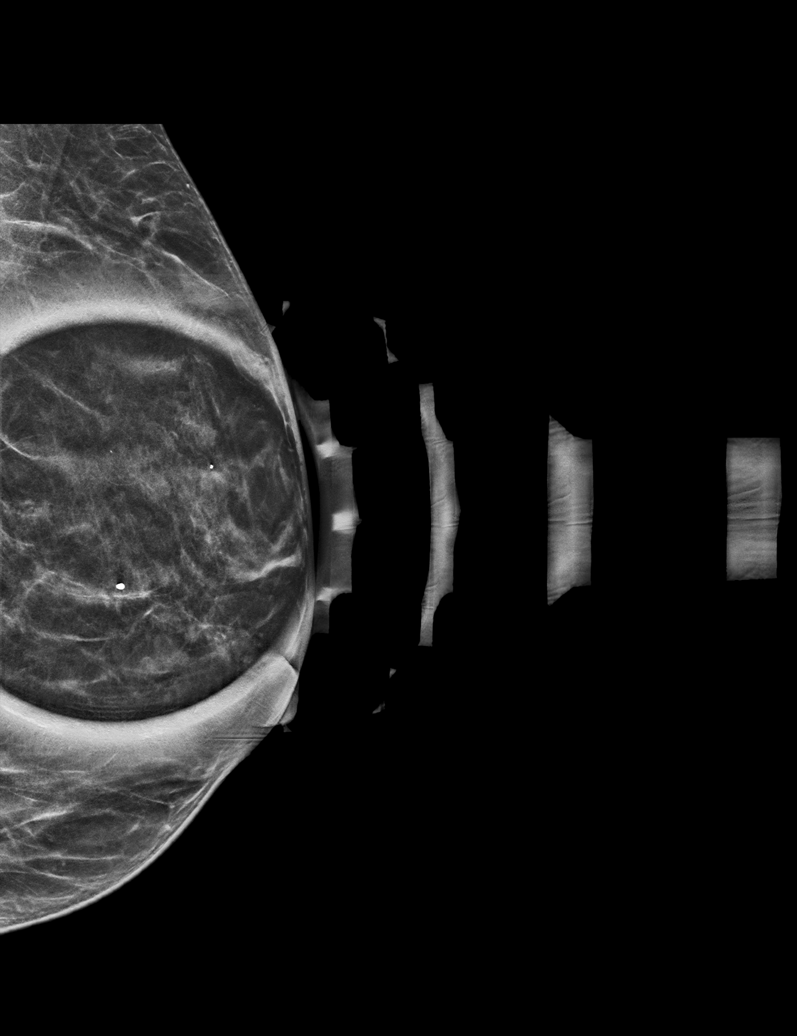

[L CC synth-2D]
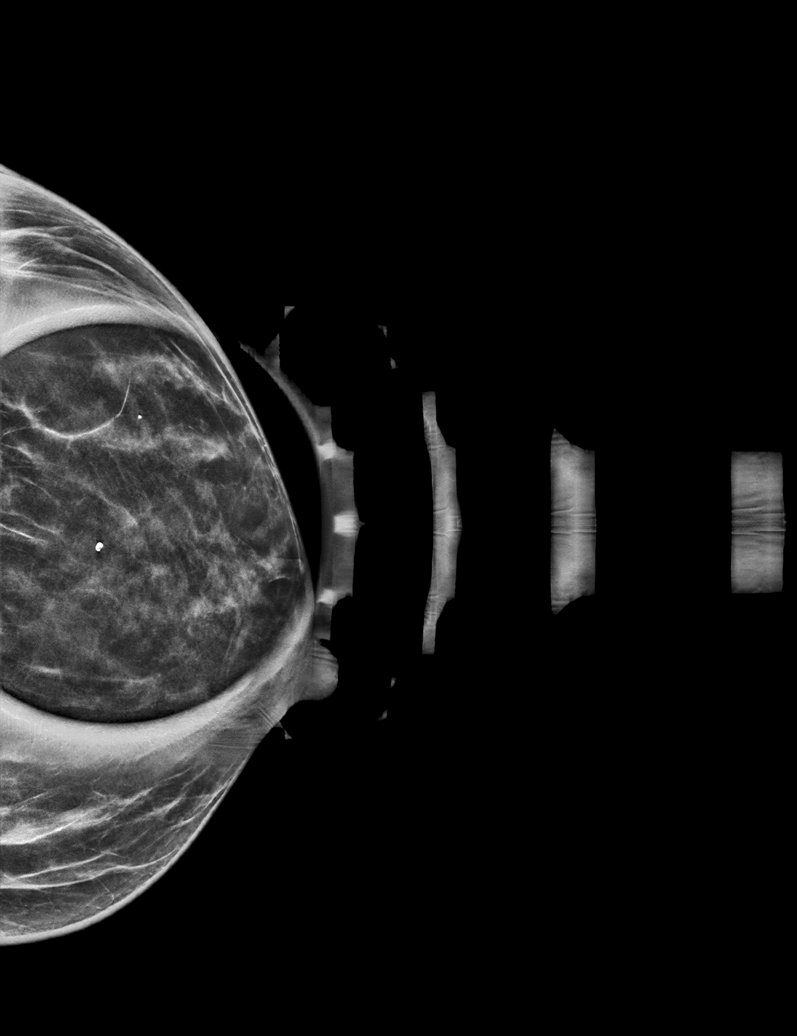

[L ML tomo · tomo slice 25/50.0]
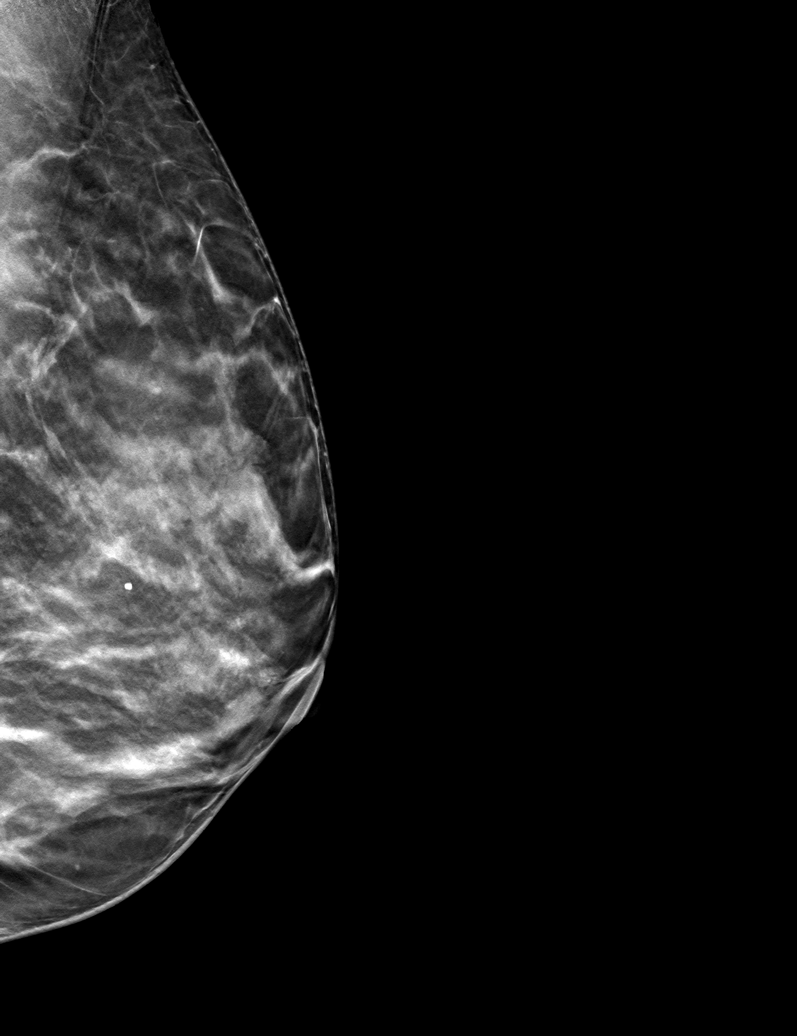

[L MLO tomo · tomo slice 24/47.0]
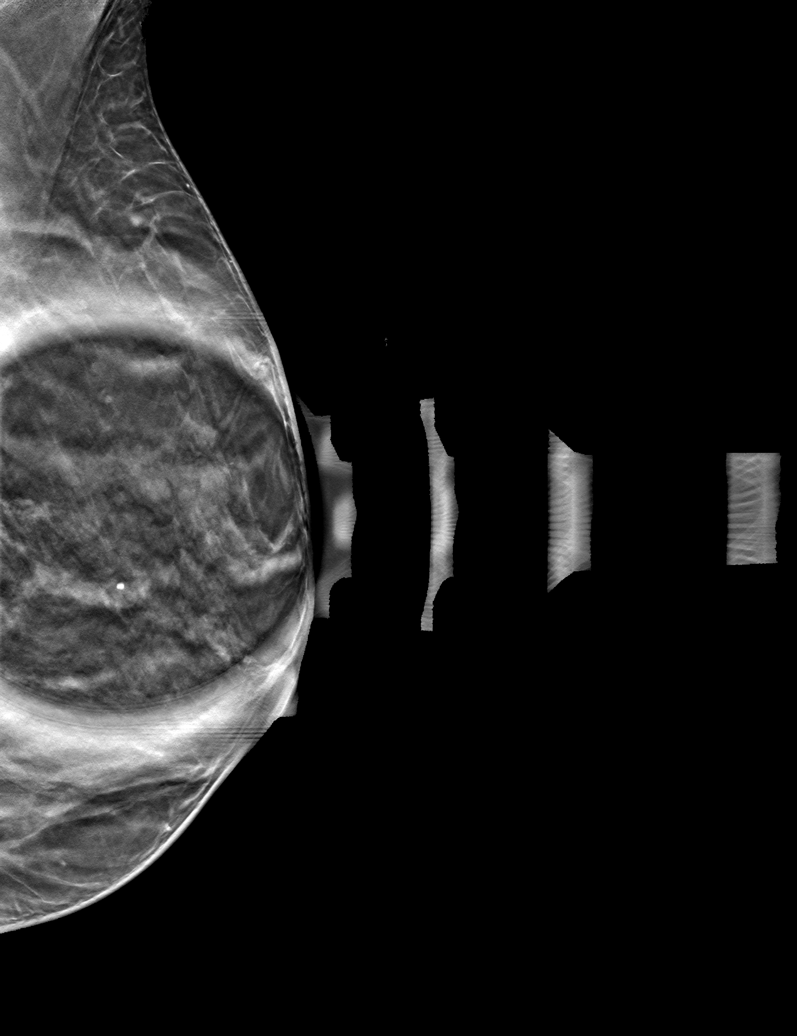

[L CC tomo · tomo slice 25/50.0]
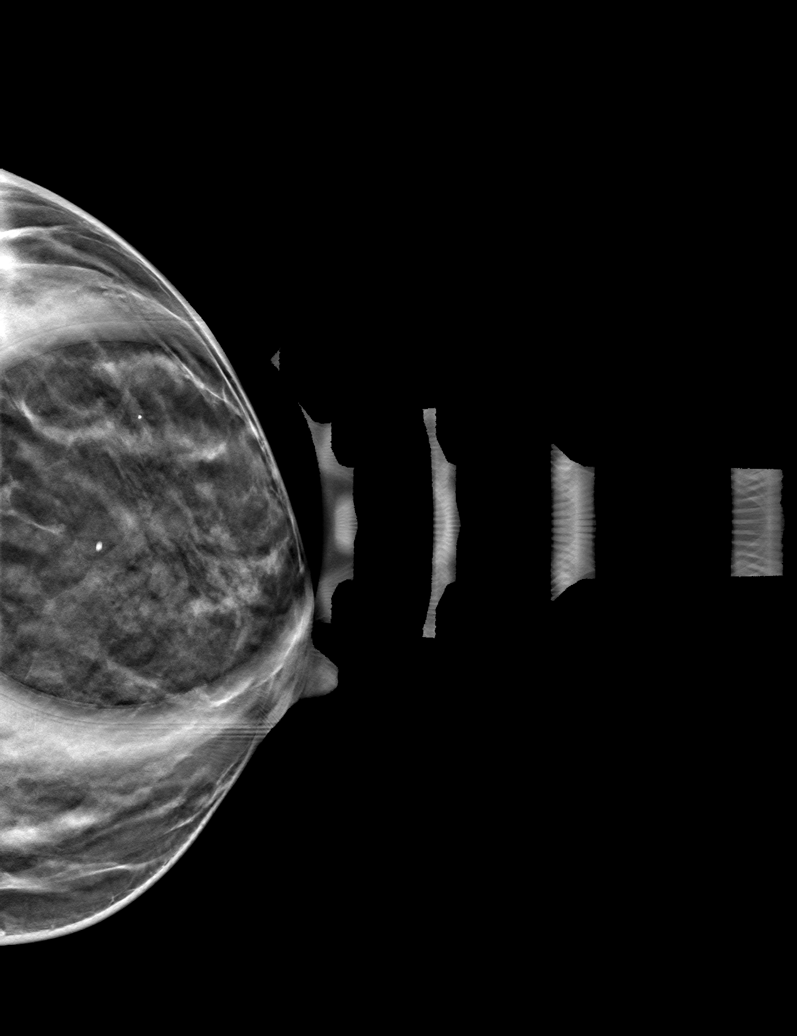

[6 of 18 positions shown; findings below may reference images not displayed]

ACR Breast Density Category c: The breast tissue is heterogeneously
dense, which may obscure small masses.
FINDINGS: Mammogram:

Spot compression tomosynthesis views and full field mL tomosynthesis
views of the left breast were performed. There is persistence of an
obscured mass in the upper outer left breast.

Ultrasound:

Targeted ultrasound performed in the left breast at 2 o'clock 2 cm
from the nipple demonstrating an oval circumscribed anechoic mass
with posterior enhancement measuring 0.6 x 0.4 x 0.7 cm, consistent
with a benign simple cyst. No internal vascularity.
IMPRESSION: Benign simple cyst in the left breast at 2 o'clock.

RECOMMENDATION:
Return to routine annual screening mammography which will be due in
March 2022.

I have discussed the findings and recommendations with the patient.
If applicable, a reminder letter will be sent to the patient
regarding the next appointment.

BI-RADS CATEGORY  2: Benign.
